# Patient Record
Sex: Male | Born: 1970 | Race: White | Hispanic: No | Marital: Married | Smoking: Never smoker
Health system: Southern US, Community
[De-identification: ages and names within clinical notes are randomized; demographics above are authoritative.]

## PROBLEM LIST (undated history)

## (undated) DIAGNOSIS — L309 Dermatitis, unspecified: Secondary | ICD-10-CM

## (undated) DIAGNOSIS — I4891 Unspecified atrial fibrillation: Secondary | ICD-10-CM

## (undated) DIAGNOSIS — R002 Palpitations: Secondary | ICD-10-CM

## (undated) HISTORY — DX: Dermatitis, unspecified: L30.9

## (undated) HISTORY — DX: Unspecified atrial fibrillation: I48.91

## (undated) HISTORY — DX: Palpitations: R00.2

---

## 2001-09-01 ENCOUNTER — Emergency Department (HOSPITAL_COMMUNITY): Admission: EM | Admit: 2001-09-01 | Discharge: 2001-09-01 | Payer: Self-pay

## 2002-08-28 ENCOUNTER — Emergency Department (HOSPITAL_COMMUNITY): Admission: EM | Admit: 2002-08-28 | Discharge: 2002-08-29 | Payer: Self-pay | Admitting: Podiatry

## 2002-08-29 ENCOUNTER — Encounter: Payer: Self-pay | Admitting: *Deleted

## 2011-03-26 ENCOUNTER — Emergency Department (INDEPENDENT_AMBULATORY_CARE_PROVIDER_SITE_OTHER): Payer: Self-pay

## 2011-03-26 ENCOUNTER — Emergency Department (HOSPITAL_BASED_OUTPATIENT_CLINIC_OR_DEPARTMENT_OTHER)
Admission: EM | Admit: 2011-03-26 | Discharge: 2011-03-26 | Disposition: A | Discharge status: Home / Self Care | Payer: Self-pay | Attending: Emergency Medicine | Admitting: Emergency Medicine

## 2011-03-26 DIAGNOSIS — E876 Hypokalemia: Secondary | ICD-10-CM | POA: Insufficient documentation

## 2011-03-26 DIAGNOSIS — R079 Chest pain, unspecified: Secondary | ICD-10-CM

## 2011-03-26 DIAGNOSIS — R002 Palpitations: Secondary | ICD-10-CM | POA: Insufficient documentation

## 2011-03-26 DIAGNOSIS — I4891 Unspecified atrial fibrillation: Secondary | ICD-10-CM | POA: Insufficient documentation

## 2011-03-26 LAB — BASIC METABOLIC PANEL
BUN: 12 mg/dL (ref 6–23)
CO2: 25 mEq/L (ref 19–32)
Calcium: 9.6 mg/dL (ref 8.4–10.5)
Chloride: 104 mEq/L (ref 96–112)
Creatinine, Ser: 1.1 mg/dL (ref 0.4–1.5)
GFR calc Af Amer: >60 mL/min (ref >60)
GFR calc non Af Amer: >60 mL/min (ref >60)
Glucose, Bld: 105 mg/dL — ABNORMAL HIGH (ref 70–99)
Potassium: 3.1 mEq/L — ABNORMAL LOW (ref 3.5–5.1)
Sodium: 140 mEq/L (ref 135–145)

## 2011-03-26 LAB — CBC
HCT: 41.8 % (ref 39.0–52.0)
Hemoglobin: 14.6 g/dL (ref 13.0–17.0)
MCH: 29.7 pg (ref 26.0–34.0)
MCHC: 34.9 g/dL (ref 30.0–36.0)
MCV: 85.1 fL (ref 78.0–100.0)
Platelets: 178 10*3/uL (ref 150–400)
RBC: 4.91 MIL/uL (ref 4.22–5.81)
RDW: 13 % (ref 11.5–15.5)
WBC: 6 10*3/uL (ref 4.0–10.5)

## 2011-03-26 LAB — CK TOTAL AND CKMB (NOT AT ARMC)
CK, MB: 3.4 ng/mL (ref 0.3–4.0)
Relative Index: 1.2 (ref 0.0–2.5)
Total CK: 282 U/L — ABNORMAL HIGH (ref 7–232)

## 2011-03-26 LAB — URINALYSIS, ROUTINE W REFLEX MICROSCOPIC
Bilirubin Urine: NEGATIVE
Glucose, UA: NEGATIVE mg/dL
Hgb urine dipstick: NEGATIVE
Specific Gravity, Urine: 1.014 (ref 1.005–1.030)

## 2011-03-26 LAB — DIFFERENTIAL
Basophils Absolute: 0 10*3/uL (ref 0.0–0.1)
Basophils Relative: 0 % (ref 0–1)
Eosinophils Absolute: 0.6 10*3/uL (ref 0.0–0.7)
Eosinophils Relative: 10 % — ABNORMAL HIGH (ref 0–5)
Lymphocytes Relative: 23 % (ref 12–46)
Lymphs Abs: 1.4 10*3/uL (ref 0.7–4.0)
Monocytes Absolute: 0.5 10*3/uL (ref 0.1–1.0)
Monocytes Relative: 8 % (ref 3–12)
Neutro Abs: 3.5 10*3/uL (ref 1.7–7.7)
Neutrophils Relative %: 59 % (ref 43–77)

## 2011-03-26 LAB — TROPONIN I: Troponin I: <0.3 ng/mL (ref <0.30)

## 2011-03-26 LAB — PROTIME-INR
INR: 0.91 (ref 0.00–1.49)
Prothrombin Time: 12.5 seconds (ref 11.6–15.2)

## 2011-04-06 ENCOUNTER — Encounter: Payer: Self-pay | Admitting: Cardiovascular Disease

## 2011-04-09 ENCOUNTER — Encounter: Payer: Self-pay | Admitting: Cardiovascular Disease

## 2011-04-09 ENCOUNTER — Ambulatory Visit (INDEPENDENT_AMBULATORY_CARE_PROVIDER_SITE_OTHER): Payer: Self-pay | Admitting: Cardiovascular Disease

## 2011-04-09 VITALS — BP 130/90 | HR 66 | Resp 18 | Ht 72.0 in | Wt 225.4 lb

## 2011-04-09 DIAGNOSIS — I4891 Unspecified atrial fibrillation: Secondary | ICD-10-CM

## 2011-04-09 DIAGNOSIS — I480 Paroxysmal atrial fibrillation: Secondary | ICD-10-CM | POA: Insufficient documentation

## 2011-04-09 NOTE — Progress Notes (Signed)
40 yo seen at [facility] 5/7 for presumed lone atrial fibrillation.  No conversion with cardizem.  Since onset defined and less than 12 hours patient was cardioverted in ER.  No recurrent palpitations since D/C Not taking any meds.  K was low in ER at 3.1  Thyroid not checked.  Prior to this no heart problems.  Active painting and plays basketball.  No history of syncope or family history of arrythmia.  Denies ETOH or other drugs.  No TIA or bleeding diathesis.    Discussed diagnosis with patient.  Need F/U echo to R/O structural heart disease.  Will repeat BMET to see if K low and check TSH as well.  Reviewed two ECG;s from 5/7.  Afib rate 110 no pre-excitation. Post DCC ECG normal  ROS: Denies fever, malais, weight loss, blurry vision, decreased visual acuity, cough, sputum, SOB, hemoptysis, pleuritic pain, palpitaitons, heartburn, abdominal pain, melena, lower extremity edema, claudication, or rash.   General: Affect appropriate Healthy:  appears stated age HEENT: normal Neck supple with no adenopathy JVP normal no bruits no thyromegaly Lungs clear with no wheezing and good diaphragmatic motion Heart:  S1/S2 no murmur,rub, gallop or click PMI normal Abdomen: benighn, BS positve, no tenderness, no AAA no bruit.  No HSM or HJR Distal pulses intact with no bruits No edema Neuro non-focal Skin warm and dry No muscular weakness  Medications No current outpatient prescriptions on file.    Allergies Review of patient's allergies indicates no known allergies.  Family History: No family history on file.  Social History: History   Social History  . Marital Status: Single    Spouse Name: N/A    Number of Children: N/A  . Years of Education: N/A   Occupational History  . Not on file.   Social History Main Topics  . Smoking status: Never Smoker   . Smokeless tobacco: Not on file  . Alcohol Use: No  . Drug Use: No  . Sexually Active: Not on file   Other Topics Concern    . Not on file   Social History Narrative  . No narrative on file    Electrocardiogram:  NSR 66 normal ECG today in clinic  Assessment and Plan

## 2011-04-09 NOTE — Patient Instructions (Signed)
Your physician recommends that you schedule a follow-up appointment in: 6 MONTHS WITH DR [facility]   Your physician recommends that you continue on your current medications as directed. Please refer to the Current Medication list given to you today.  Your physician has requested that you have an echocardiogram. Echocardiography is a painless test that uses sound waves to create images of your heart. It provides your doctor with information about the size and shape of your heart and how well your heart's chambers and valves are working. This procedure takes approximately one hour. There are no restrictions for this procedure. AT PT 'S CONVENIENCE  DX AFIB  Your physician recommends that you return for lab work in: SAME DAY AS ECHO  BMET TSH  T4  [phone]

## 2011-04-09 NOTE — Assessment & Plan Note (Signed)
Isolated PAF with no evidence of structural heart disease and normal baseline ECG.  [place] score 0.  F/U echo

## 2011-04-19 ENCOUNTER — Other Ambulatory Visit: Payer: Self-pay | Admitting: *Deleted

## 2011-04-19 ENCOUNTER — Other Ambulatory Visit (HOSPITAL_COMMUNITY): Payer: Self-pay | Admitting: Radiology

## 2011-04-20 ENCOUNTER — Other Ambulatory Visit (HOSPITAL_COMMUNITY): Payer: Self-pay | Admitting: Radiology

## 2011-04-27 ENCOUNTER — Other Ambulatory Visit (HOSPITAL_COMMUNITY): Payer: Self-pay | Admitting: Radiology

## 2011-04-27 ENCOUNTER — Other Ambulatory Visit: Payer: Self-pay | Admitting: *Deleted

## 2011-05-08 ENCOUNTER — Other Ambulatory Visit (INDEPENDENT_AMBULATORY_CARE_PROVIDER_SITE_OTHER): Payer: Self-pay | Admitting: *Deleted

## 2011-05-08 ENCOUNTER — Ambulatory Visit (HOSPITAL_COMMUNITY): Payer: Self-pay | Attending: Cardiovascular Disease | Admitting: Radiology

## 2011-05-08 DIAGNOSIS — I079 Rheumatic tricuspid valve disease, unspecified: Secondary | ICD-10-CM | POA: Insufficient documentation

## 2011-05-08 DIAGNOSIS — I4891 Unspecified atrial fibrillation: Secondary | ICD-10-CM

## 2011-05-08 LAB — BASIC METABOLIC PANEL
Calcium: 8.9 mg/dL (ref 8.4–10.5)
GFR: 77.54 mL/min (ref >60.00)
Sodium: 141 mEq/L (ref 135–145)

## 2011-05-08 LAB — TSH: TSH: 2.74 u[IU]/mL (ref 0.35–5.50)

## 2011-05-17 ENCOUNTER — Telehealth: Payer: Self-pay | Admitting: Cardiovascular Disease

## 2011-05-17 ENCOUNTER — Other Ambulatory Visit: Payer: Self-pay | Admitting: *Deleted

## 2011-05-17 DIAGNOSIS — I4891 Unspecified atrial fibrillation: Secondary | ICD-10-CM

## 2011-05-17 NOTE — Telephone Encounter (Signed)
PT AWARE OF LAB AND ECHO RESULTS ./CY 

## 2011-05-17 NOTE — Telephone Encounter (Signed)
Pt returning call from [place].

## 2011-05-21 NOTE — Progress Notes (Signed)
PT AWARE OF LAB RESULTS./CY 

## 2013-01-26 IMAGING — CR DG CHEST 1V PORT
1 series · 1 of 1 positions shown · non-contrast
Comparison: None.

CLINICAL DATA: Chest pain

PORTABLE CHEST - 1 VIEW

[view not recorded]
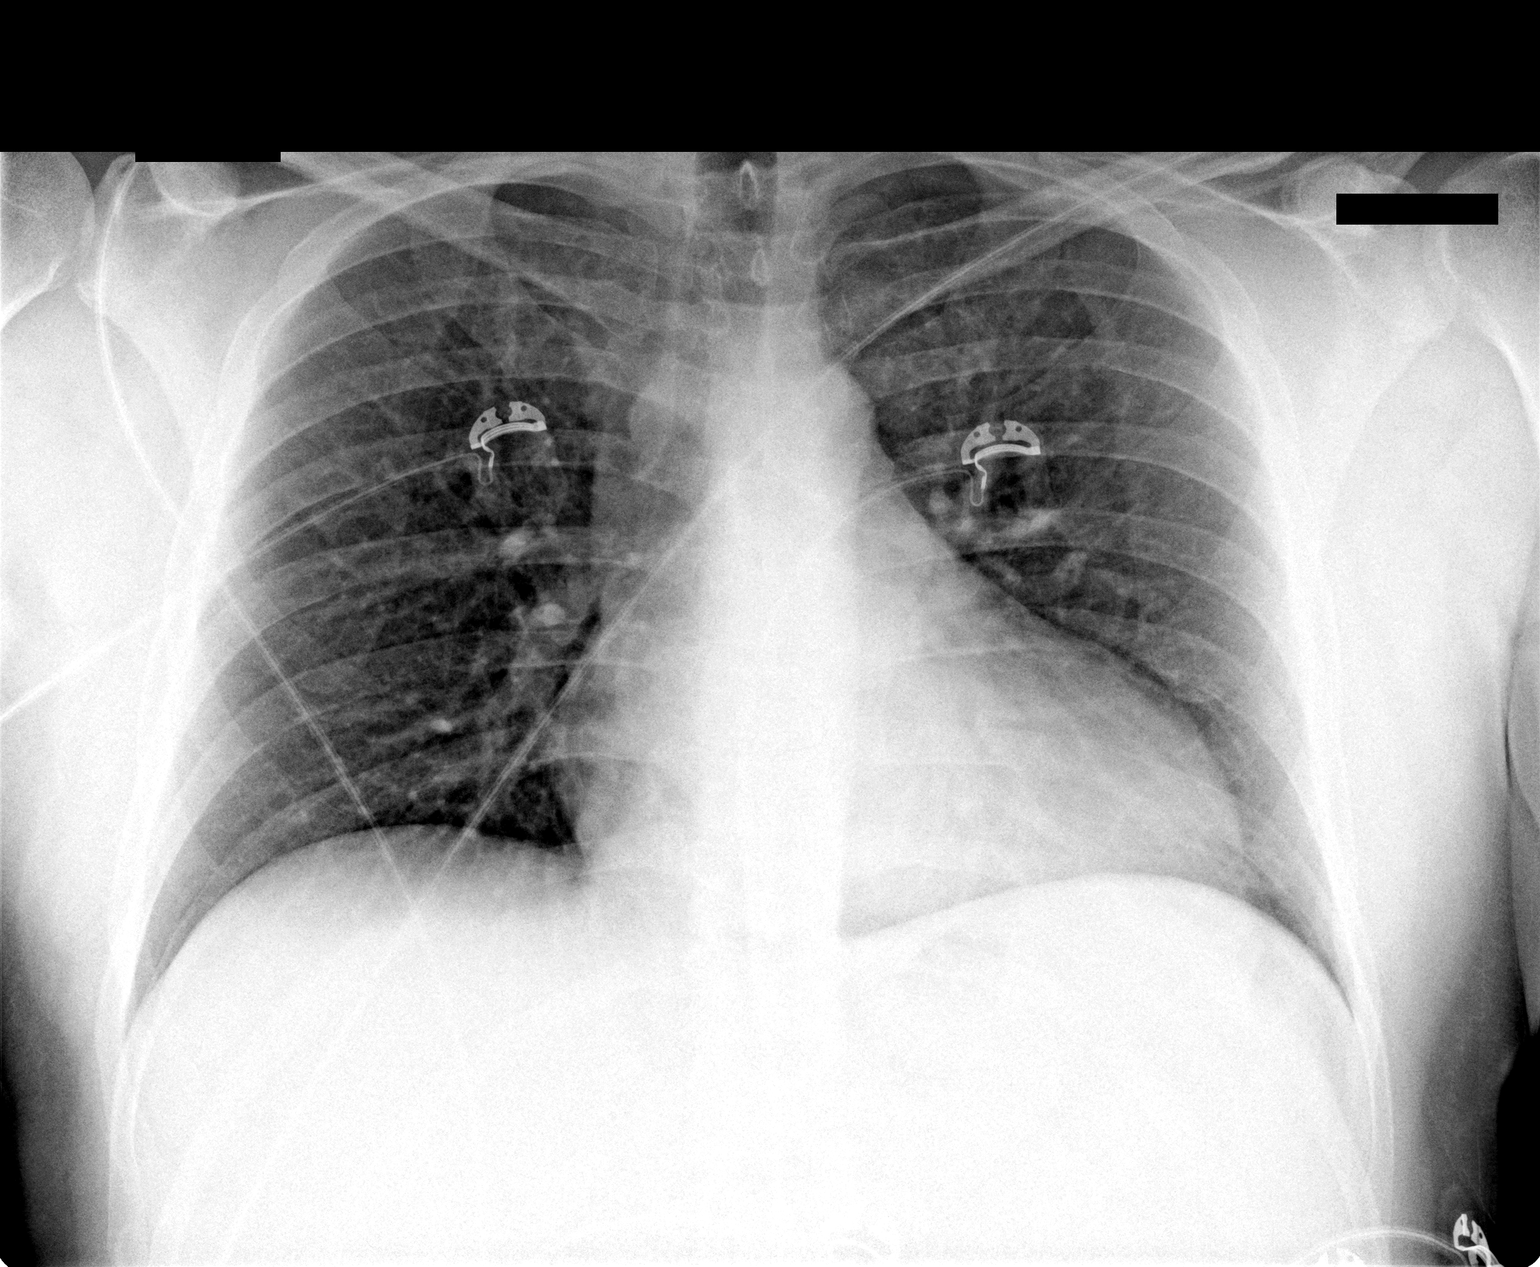

[1 of 1 positions shown; findings below may reference images not displayed]

FINDINGS: Heart size upper normal considering AP projection.  No
congestive heart failure or active disease in one-view.
IMPRESSION: No active disease in one-view.

## 2021-12-18 ENCOUNTER — Other Ambulatory Visit (HOSPITAL_BASED_OUTPATIENT_CLINIC_OR_DEPARTMENT_OTHER): Payer: Self-pay

## 2021-12-18 ENCOUNTER — Emergency Department (HOSPITAL_BASED_OUTPATIENT_CLINIC_OR_DEPARTMENT_OTHER): Payer: Self-pay

## 2021-12-18 ENCOUNTER — Emergency Department (HOSPITAL_BASED_OUTPATIENT_CLINIC_OR_DEPARTMENT_OTHER)
Admission: EM | Admit: 2021-12-18 | Discharge: 2021-12-18 | Disposition: A | Discharge status: Home / Self Care | Payer: Self-pay | Attending: Emergency Medicine | Admitting: Emergency Medicine

## 2021-12-18 ENCOUNTER — Encounter (HOSPITAL_BASED_OUTPATIENT_CLINIC_OR_DEPARTMENT_OTHER): Payer: Self-pay | Admitting: *Deleted

## 2021-12-18 DIAGNOSIS — Z7901 Long term (current) use of anticoagulants: Secondary | ICD-10-CM | POA: Insufficient documentation

## 2021-12-18 DIAGNOSIS — I480 Paroxysmal atrial fibrillation: Secondary | ICD-10-CM

## 2021-12-18 DIAGNOSIS — I4891 Unspecified atrial fibrillation: Secondary | ICD-10-CM | POA: Insufficient documentation

## 2021-12-18 LAB — CBC WITH DIFFERENTIAL/PLATELET
Abs Immature Granulocytes: 0.01 10*3/uL (ref 0.00–0.07)
Basophils Absolute: 0 10*3/uL (ref 0.0–0.1)
Basophils Relative: 0 %
Eosinophils Absolute: 0.3 10*3/uL (ref 0.0–0.5)
Eosinophils Relative: 5 %
HCT: 42.9 % (ref 39.0–52.0)
Hemoglobin: 15 g/dL (ref 13.0–17.0)
Immature Granulocytes: 0 %
Lymphocytes Relative: 36 %
Lymphs Abs: 1.9 10*3/uL (ref 0.7–4.0)
MCH: 29.8 pg (ref 26.0–34.0)
MCHC: 35 g/dL (ref 30.0–36.0)
MCV: 85.3 fL (ref 80.0–100.0)
Monocytes Absolute: 0.4 10*3/uL (ref 0.1–1.0)
Monocytes Relative: 7 %
Neutro Abs: 2.7 10*3/uL (ref 1.7–7.7)
Neutrophils Relative %: 52 %
Platelets: 168 10*3/uL (ref 150–400)
RBC: 5.03 MIL/uL (ref 4.22–5.81)
RDW: 13 % (ref 11.5–15.5)
WBC: 5.3 10*3/uL (ref 4.0–10.5)
nRBC: 0 % (ref 0.0–0.2)

## 2021-12-18 LAB — COMPREHENSIVE METABOLIC PANEL
ALT: 46 U/L — ABNORMAL HIGH (ref 0–44)
AST: 23 U/L (ref 15–41)
Albumin: 4.7 g/dL (ref 3.5–5.0)
Alkaline Phosphatase: 74 U/L (ref 38–126)
Anion gap: 8 (ref 5–15)
BUN: 19 mg/dL (ref 6–20)
CO2: 25 mmol/L (ref 22–32)
Calcium: 9.2 mg/dL (ref 8.9–10.3)
Chloride: 104 mmol/L (ref 98–111)
Creatinine, Ser: 1.22 mg/dL (ref 0.61–1.24)
GFR, Estimated: >60 mL/min (ref >60)
Glucose, Bld: 114 mg/dL — ABNORMAL HIGH (ref 70–99)
Potassium: 3.8 mmol/L (ref 3.5–5.1)
Sodium: 137 mmol/L (ref 135–145)
Total Bilirubin: 0.6 mg/dL (ref 0.3–1.2)
Total Protein: 7.7 g/dL (ref 6.5–8.1)

## 2021-12-18 LAB — TROPONIN I (HIGH SENSITIVITY)
Troponin I (High Sensitivity): 6 ng/L (ref <18)
Troponin I (High Sensitivity): 5 ng/L (ref <18)

## 2021-12-18 MED ORDER — APIXABAN 5 MG PO TABS
5.0000 mg | ORAL_TABLET | Freq: Two times a day (BID) | ORAL | 0 refills | Status: AC
  Filled 2021-12-18: qty 60, 30d supply, fill #0

## 2021-12-18 MED ORDER — ETOMIDATE 2 MG/ML IV SOLN
INTRAVENOUS | Status: AC | PRN
  Administered 2021-12-18: 10 mg via INTRAVENOUS

## 2021-12-18 MED ORDER — ETOMIDATE 2 MG/ML IV SOLN
10.0000 mg | Freq: Once | INTRAVENOUS | Status: DC
  Filled 2021-12-18: qty 10

## 2021-12-18 MED ORDER — APIXABAN 5 MG PO TABS: 5.0000 mg | ORAL_TABLET | Freq: Two times a day (BID) | ORAL | 0 refills | Status: DC

## 2021-12-18 MED ORDER — APIXABAN 2.5 MG PO TABS
5.0000 mg | ORAL_TABLET | Freq: Two times a day (BID) | ORAL | Status: DC
  Administered 2021-12-18: 5 mg via ORAL
  Filled 2021-12-18: qty 2

## 2021-12-18 NOTE — Sedation Documentation (Signed)
BROTHER IS HERE TO DRIVE PATIENT HOME DENIES ANY PAIN GCS 15

## 2021-12-18 NOTE — ED Triage Notes (Signed)
States his HR feels irregular, denies any chest pain or shortness or breath.

## 2021-12-18 NOTE — ED Notes (Signed)
ALDRETE SCORE REMAINS AT 10 GCS AT 15 REMAINS IN NSR CONT TO DENY ANY CHEST PAIN/ RESP ISSUES BROTHER AT BEDSIDE TO DRIVE PT HOME

## 2021-12-18 NOTE — Sedation Documentation (Signed)
TOLERATED SEDATION WELL, NO ADVERSE EFFECTS NOTED, REMAINS PAIN FREE, ALDRETE SCORE 10, GCS 15. BROTHER AT BEDSIDE (BROTHER WILL BE DRIVING CLIENT HOME)

## 2021-12-18 NOTE — Progress Notes (Signed)
ANTICOAGULATION CONSULT NOTE - Initial Consult  Pharmacy Consult for apixaban Indication: atrial fibrillation  No Known Allergies  Patient Measurements: Height: 6' (182.9 cm) Weight: 108.9 kg (240 lb) IBW/kg (Calculated) : 77.6  Vital Signs: Temp: 98.3 F (36.8 C) (01/30 0916) Temp Source: Oral (01/30 0916) BP: 125/84 (01/30 0950) Pulse Rate: 78 (01/30 0950)  Labs: Recent Labs    12/18/21 0820  HGB 15.0  HCT 42.9  PLT 168  CREATININE 1.22  TROPONINIHS 6    Estimated Creatinine Clearance: 91.3 mL/min (by C-G formula based on SCr of 1.22 mg/dL).   Medical History: Past Medical History:  Diagnosis Date   Atrial fibrillation ([place])    Eczema    Palpitations    Assessment: 72 yom presented to the ED with an irregular heartbeat. To start apixaban. Baseline CBC is WNL and pt has good renal function.   Goal of Therapy:  Stroke prevention  Plan:  Apixaban 5mg  PO BID F/u renal fxn, S&S of bleeding  Nyquan Selbe, Rande Lawman 12/18/2021,10:04 AM

## 2021-12-18 NOTE — ED Provider Notes (Signed)
Jonathan Merritt   CSN: [id number] Arrival date & time: 12/18/21  0747     History  Chief Complaint  Patient presents with   irregular HR    Jonathan Merritt is a 51 y.o. male.  HPI     51 year old male with history of atrial fibrillation 03/2011 with no other episodes presents with concern for atrial fibrillation.  Felt ok this morning and then developed feeling of palpitations like he had 10 years ago. Feeling of being off, palpitations, irregular. No shortness of breat, no chest pain.  No syncope. No fever, nausea, vomiting, diarrhea, black or bloody stools. Started a multivitamin. No leg pain or swelling. No other concerns. No caffeine, etoh, other changes.  Wears an apple watch and has not had other episodes per that or symptoms.     Past Medical History:  Diagnosis Date   Atrial fibrillation ([place])    Eczema    Palpitations     Home Medications Prior to Admission medications   Medication Sig Start Date End Date Taking? Authorizing Provider  apixaban (ELIQUIS) 5 MG TABS tablet Take 1 tablet (5 mg total) by mouth 2 (two) times daily. 12/18/21 01/17/22  Gareth Morgan, MD      Allergies    Patient has no known allergies.    Review of Systems   Review of Systems See above Physical Exam Updated Vital Signs BP 133/84 (BP Location: Right Arm)    Pulse 82    Temp 98.3 F (36.8 C) (Oral)    Resp (!) 24    Ht 6' (1.829 m)    Wt 108.9 kg    SpO2 100%    BMI 32.55 kg/m  Physical Exam  ED Results / Procedures / Treatments   Labs (all labs ordered are listed, but only abnormal results are displayed) Labs Reviewed  COMPREHENSIVE METABOLIC PANEL - Abnormal; Notable for the following components:      Result Value   Glucose, Bld 114 (*)    ALT 46 (*)    All other components within normal limits  CBC WITH DIFFERENTIAL/PLATELET  TROPONIN I (HIGH SENSITIVITY)  TROPONIN I (HIGH SENSITIVITY)    EKG EKG  Interpretation  Date/Time:  Monday December 18 2021 08:00:32 EST Ventricular Rate:  124 PR Interval:    QRS Duration: 92 QT Interval:  274 QTC Calculation: 393 R Axis:   21 Text Interpretation: Atrial fibrillation with rapid ventricular response Nonspecific ST abnormality Abnormal ECG When compared with ECG of 26-Mar-2011 12:01, ECG now shows atrial fibrillation with RVR Confirmed by Gareth Morgan [phone]) on 12/18/2021 8:20:06 AM  Radiology DG Chest Portable 1 View  Result Date: 12/18/2021 CLINICAL DATA:  Sudden onset AFib EXAM: PORTABLE CHEST 1 VIEW COMPARISON:  03/26/2011 FINDINGS: Cardiomegaly. Both lungs are clear. The visualized skeletal structures are unremarkable. IMPRESSION: Cardiomegaly without acute abnormality of the lungs in AP portable projection. Electronically Signed   By: Delanna Ahmadi M.D.   On: 12/18/2021 08:32    Procedures .Critical Care Performed by: Gareth Morgan, MD Authorized by: Gareth Morgan, MD   Critical care provider statement:    Critical care time (minutes):  30   Critical care was time spent personally by me on the following activities:  Development of treatment plan with patient or surrogate, discussions with consultants, evaluation of patient's response to treatment, examination of patient, ordering and review of laboratory studies, ordering and review of radiographic studies, ordering and performing treatments and interventions, pulse oximetry, re-evaluation of patient's  condition and review of old charts .Sedation  Date/Time: 12/18/2021 10:49 PM Performed by: Gareth Morgan, MD Authorized by: Gareth Morgan, MD   Universal protocol:    Immediately prior to procedure, a time out was called: yes     Patient identity confirmed:  Verbally with patient Indications:    Procedure performed:  Cardioversion Pre-sedation assessment:    Time since last food or drink:  1   ASA classification: class 1 - normal, healthy patient     Mallampati  score:  IV - only hard palate visible   Pre-sedation assessments completed and reviewed: airway patency, cardiovascular function, hydration status, mental status, nausea/vomiting, pain level, respiratory function and temperature   Immediate pre-procedure details:    Reviewed: vital signs, relevant labs/tests and NPO status     Verified: bag valve mask available, emergency equipment available and IV patency confirmed   Procedure details (see MAR for exact dosages):    Preoxygenation:  Nasal cannula   Sedation:  Etomidate   Intended level of sedation: deep   Intra-procedure monitoring:  Blood pressure monitoring, cardiac monitor, continuous capnometry, continuous pulse oximetry, frequent LOC assessments and frequent vital sign checks   Intra-procedure events: none     Total Provider sedation time (minutes):  20 Post-procedure details:    Post-sedation assessments completed and reviewed: airway patency and mental status     Patient is stable for discharge or admission: yes     Procedure completion:  Tolerated well, no immediate complications .Cardioversion  Date/Time: 12/18/2021 11:20 PM Performed by: Gareth Morgan, MD Authorized by: Gareth Morgan, MD   Consent:    Consent obtained:  Verbal and written   Consent given by:  Patient   Risks discussed:  Death, cutaneous burn, induced arrhythmia and pain   Alternatives discussed:  Rate-control medication Pre-procedure details:    Cardioversion basis:  Elective   Rhythm:  Atrial fibrillation   Electrode placement:  Anterior-posterior Patient sedated: Yes. Refer to sedation procedure documentation for details of sedation.  Attempt one:    Cardioversion mode:  Synchronous   Shock (Joules):  120   Shock outcome:  Conversion to normal sinus rhythm Post-procedure details:    Patient status:  Awake   Patient tolerance of procedure:  Tolerated well, no immediate complications    Medications Ordered in ED Medications  etomidate  (AMIDATE) injection (10 mg Intravenous Given 12/18/21 [id number])    ED Course/ Medical Decision Making/ A&P                           Medical Decision Making Amount and/or Complexity of Data Reviewed Labs: ordered. Radiology: ordered. ECG/medicine tests: ordered.  Risk Prescription drug management.    51 year old male with history of atrial fibrillation 03/2011 with no other episodes presents with concern for atrial fibrillation.  ECG evaluated by me confirms atrial fibrillation, has HR 140s-160s.  Given symptoms beginning just prior to arrival, paroxysmal atrial fibrillation, he is a candidate for cardioversion. Discussed risks, benefits and alternative treatments such as rate control medications.  He would like to pursue cardioversion.    Labs interpreted by me show no significant electrolyte abnormalities, anemia, normal troponin x2. Doubt ACS, PE. Unclear trigger for this episode.  Cardioverted and now in normal sinus rhythm, asymptomatic.  Discussed with Dr. Marlou Porch. CHADSVasc 0, however in setting of cardioversion will initiate anticoagulation with eliquis. Discussed bleeding risk.  Given rx for eliquis, Afib Clinic follow up number. Patient discharged in stable condition with  understanding of reasons to return.           Final Clinical Impression(s) / ED Diagnoses Final diagnoses:  Paroxysmal atrial fibrillation ([place])    Rx / DC Orders ED Discharge Orders          Ordered    apixaban (ELIQUIS) 5 MG TABS tablet  2 times daily,   Status:  Discontinued        12/18/21 1008    apixaban (ELIQUIS) 5 MG TABS tablet  2 times daily        12/18/21 1009              Gareth Morgan, MD 12/18/21 2321

## 2021-12-18 NOTE — ED Notes (Signed)
2ND TROPONIN LEVEL OBTAINED AND TO THE LAB

## 2021-12-18 NOTE — Sedation Documentation (Signed)
Due to sedation unable to assess pain

## 2021-12-18 NOTE — ED Notes (Signed)
Patient placed on 2 LNC with ETCO2. Suction setup and ambu at bedside.

## 2021-12-19 ENCOUNTER — Telehealth (HOSPITAL_COMMUNITY): Payer: Self-pay

## 2021-12-19 NOTE — Telephone Encounter (Signed)
Left message for patient to call back to schedule ED follow up appointment at the [facility].

## 2021-12-28 ENCOUNTER — Other Ambulatory Visit: Payer: Self-pay

## 2021-12-28 ENCOUNTER — Encounter (HOSPITAL_COMMUNITY): Payer: Self-pay | Admitting: Nurse Practitioner

## 2021-12-28 ENCOUNTER — Ambulatory Visit (HOSPITAL_COMMUNITY)
Admission: RE | Admit: 2021-12-28 | Discharge: 2021-12-28 | Disposition: A | Discharge status: Home / Self Care | Payer: Self-pay | Source: Ambulatory Visit | Attending: Nurse Practitioner | Admitting: Nurse Practitioner

## 2021-12-28 VITALS — BP 136/86 | HR 61 | Ht 72.0 in | Wt 244.6 lb

## 2021-12-28 DIAGNOSIS — Z7901 Long term (current) use of anticoagulants: Secondary | ICD-10-CM | POA: Insufficient documentation

## 2021-12-28 DIAGNOSIS — I480 Paroxysmal atrial fibrillation: Secondary | ICD-10-CM | POA: Insufficient documentation

## 2021-12-28 NOTE — Progress Notes (Signed)
Primary Care Physician: Pcp, No Referring Physician: [facility] ER f/u   Jonathan Merritt is a 51 y.o. male with a h/o very remote afib episode that required cardioversion but quiet until recently when it appeared to come on unprovoked. His apple watch indicated that his  HR's were up to 180 bpm. He presented to the ER and underwent successful cardioversion. He is now in the afib clinic for f/u and remains in S brady. He has a CHA2DS2VASc  score of 0 and will need to take eliquis 5 mg bid for 4 weeks and then can stop drug. He does not smoke or drink alcohol.   Today, he denies symptoms of palpitations, chest pain, shortness of breath, orthopnea, PND, lower extremity edema, dizziness, presyncope, syncope, or neurologic sequela. The patient is tolerating medications without difficulties and is otherwise without complaint today.   Past Medical History:  Diagnosis Date   Atrial fibrillation ([facility])    Eczema    Palpitations    No past surgical history on file.  Current Outpatient Medications  Medication Sig Dispense Refill   apixaban (ELIQUIS) 5 MG TABS tablet Take 1 tablet (5 mg total) by mouth 2 (two) times daily. 60 tablet 0   No current facility-administered medications for this encounter.    No Known Allergies  Social History   Socioeconomic History   Marital status: Married    Spouse name: Not on file   Number of children: Not on file   Years of education: Not on file   Highest education level: Not on file  Occupational History   Not on file  Tobacco Use   Smoking status: Never   Smokeless tobacco: Not on file  Substance and Sexual Activity   Alcohol use: No   Drug use: No   Sexual activity: Not on file  Other Topics Concern   Not on file  Social History Narrative   Not on file   Social Determinants of Health   Financial Resource Strain: Not on file  Food Insecurity: Not on file  Transportation Needs: Not on file  Physical Activity: Not on file  Stress: Not on file   Social Connections: Not on file  Intimate Partner Violence: Not on file    No family history on file.  ROS- All systems are reviewed and negative except as per the HPI above  Physical Exam: Vitals:   12/28/21 0854  BP: 136/86  Pulse: 61  Weight: 110.9 kg  Height: 6' (1.829 m)   Wt Readings from Last 3 Encounters:  12/28/21 110.9 kg  12/18/21 108.9 kg  04/09/11 (!) 102.2 kg    Labs: Lab Results  Component Value Date   NA 137 12/18/2021   K 3.8 12/18/2021   CL 104 12/18/2021   CO2 25 12/18/2021   GLUCOSE 114 (H) 12/18/2021   BUN 19 12/18/2021   CREATININE 1.22 12/18/2021   CALCIUM 9.2 12/18/2021   Lab Results  Component Value Date   INR 0.91 03/26/2011   No results found for: CHOL, HDL, LDLCALC, TRIG   GEN- The patient is well appearing, alert and oriented x 3 today.   Head- normocephalic, atraumatic Eyes-  Sclera clear, conjunctiva pink Ears- hearing intact Oropharynx- clear Neck- supple, no JVP Lymph- no cervical lymphadenopathy Lungs- Clear to ausculation bilaterally, normal work of breathing Heart- Regular rate and rhythm, no murmurs, rubs or gallops, PMI not laterally displaced GI- soft, NT, ND, + BS Extremities- no clubbing, cyanosis, or edema MS- no significant deformity or  atrophy Skin- no rash or lesion Psych- euthymic mood, full affect Neuro- strength and sensation are intact  EKG- Vent. rate 61 BPM PR interval 166 ms QRS duration 90 ms QT/QTcB 412/414 ms P-R-T axes 40 20 10 Normal sinus rhythm Normal ECG When compared with ECG of 18-Dec-2021 09:09, PREVIOUS ECG IS PRESENT    Assessment and Plan:  1. Paroxysmal afib  Very remote initial episode that was cardioverted  Episode was unprovoked and associated with RVR Presented to ED with successful CV and remains in SR today  Triggers discussed and no outstanding lifestyle issues  2. CHA2DS2VASc  score of 0 Finish eliquis 5 mg bid x 4 weeks and can then stop drug per guidelines    F/u with with afib clinic as needed   Lupita Leash [facility] [address] [place], [place] [address] [phone]

## 2022-12-27 ENCOUNTER — Encounter (HOSPITAL_COMMUNITY): Payer: Self-pay | Admitting: *Deleted

## 2023-10-21 IMAGING — DX DG CHEST 1V PORT
1 series · 1 of 1 positions shown · non-contrast
Comparison: 03/26/2011

CLINICAL DATA: Sudden onset AFib

EXAM:
PORTABLE CHEST 1 VIEW

[chest ap]
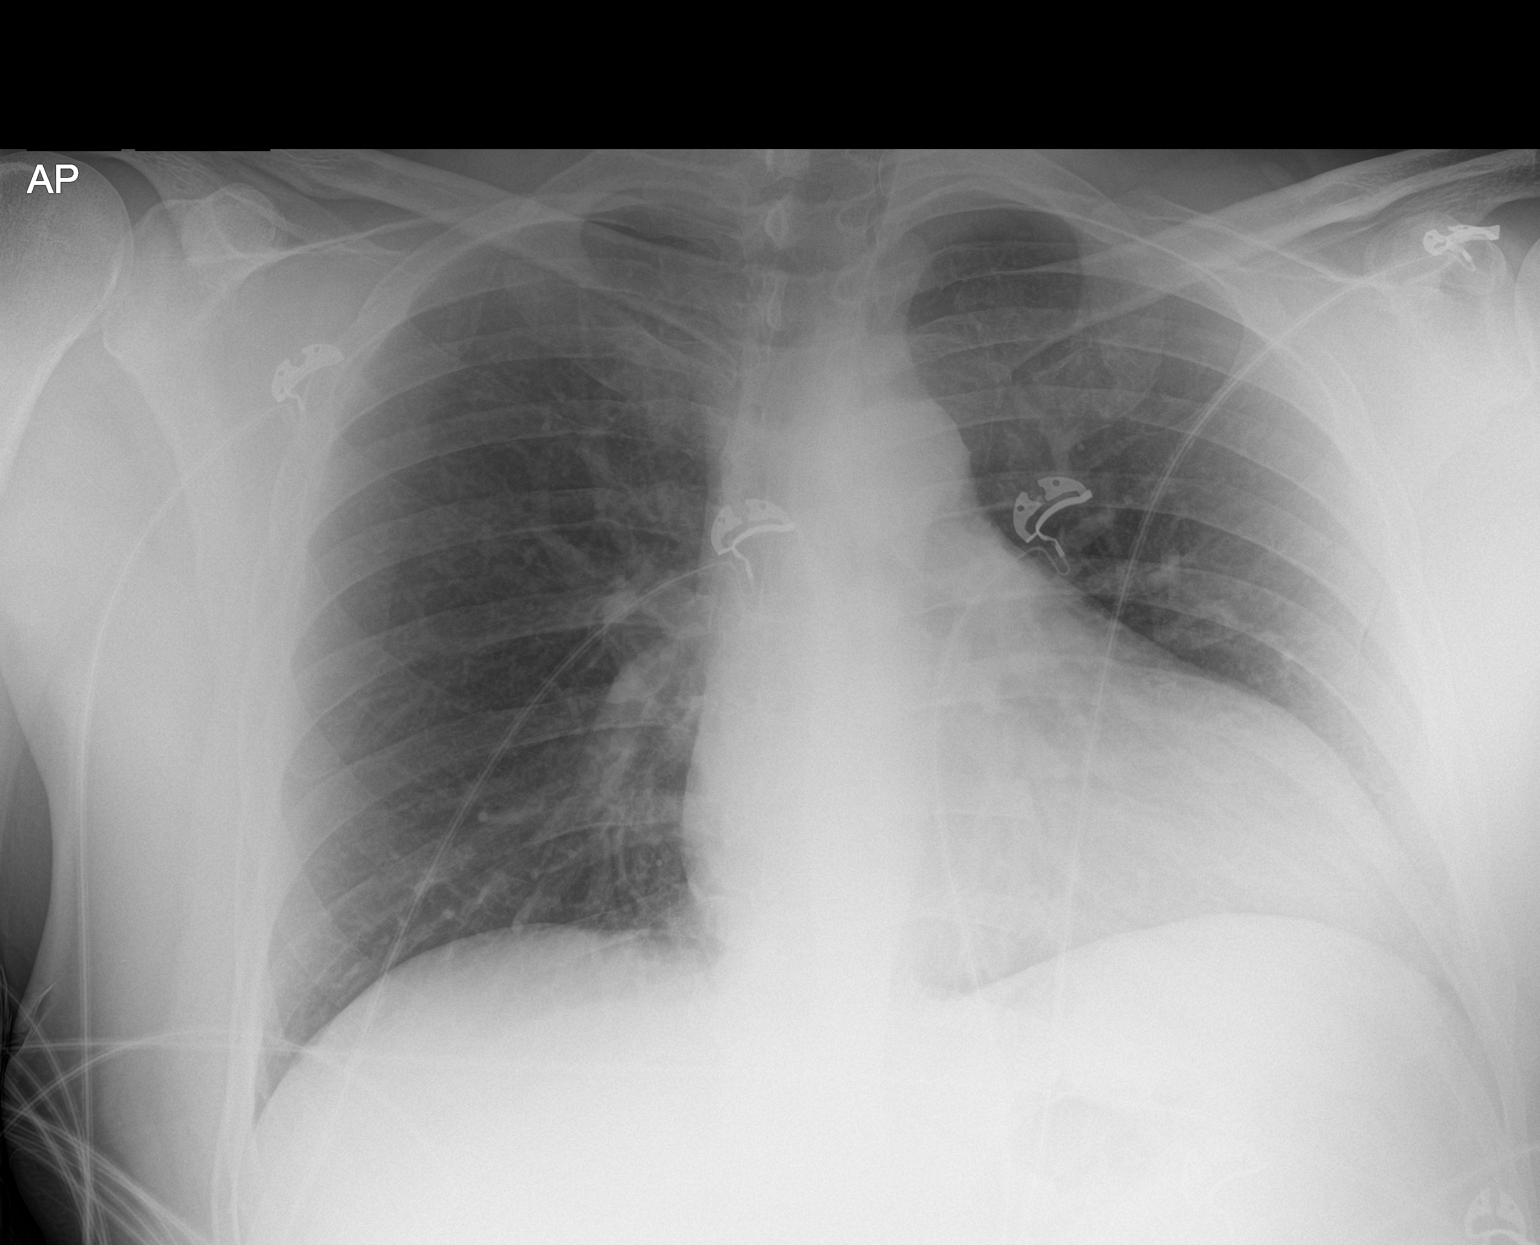

[1 of 1 positions shown; findings below may reference images not displayed]

FINDINGS: Cardiomegaly. Both lungs are clear. The visualized skeletal
structures are unremarkable.
IMPRESSION: Cardiomegaly without acute abnormality of the lungs in AP portable
projection.
